# Patient Record
Sex: Female | Born: 1940 | Race: White | Hispanic: No | Marital: Single | Smoking: Never smoker
Health system: Southern US, Community
[De-identification: ages and names within clinical notes are randomized; demographics above are authoritative.]

## PROBLEM LIST (undated history)

## (undated) DIAGNOSIS — D649 Anemia, unspecified: Secondary | ICD-10-CM

## (undated) DIAGNOSIS — I10 Essential (primary) hypertension: Secondary | ICD-10-CM

## (undated) DIAGNOSIS — L409 Psoriasis, unspecified: Secondary | ICD-10-CM

## (undated) DIAGNOSIS — M419 Scoliosis, unspecified: Secondary | ICD-10-CM

## (undated) DIAGNOSIS — M1990 Unspecified osteoarthritis, unspecified site: Secondary | ICD-10-CM

## (undated) DIAGNOSIS — E7800 Pure hypercholesterolemia, unspecified: Secondary | ICD-10-CM

## (undated) DIAGNOSIS — M810 Age-related osteoporosis without current pathological fracture: Secondary | ICD-10-CM

## (undated) DIAGNOSIS — Z8489 Family history of other specified conditions: Secondary | ICD-10-CM

## (undated) HISTORY — PX: COLONOSCOPY: SHX174

## (undated) HISTORY — PX: CHOLECYSTECTOMY: SHX55

## (undated) HISTORY — PX: BACK SURGERY: SHX140

## (undated) HISTORY — PX: TONSILLECTOMY: SUR1361

## (undated) HISTORY — PX: BELPHAROPTOSIS REPAIR: SHX369

## (undated) HISTORY — PX: EYE SURGERY: SHX253

---

## 2004-09-19 ENCOUNTER — Ambulatory Visit: Payer: Self-pay | Admitting: Internal Medicine

## 2006-08-13 ENCOUNTER — Ambulatory Visit: Payer: Self-pay | Admitting: Gastroenterology

## 2007-08-06 ENCOUNTER — Ambulatory Visit: Payer: Self-pay | Admitting: Internal Medicine

## 2008-08-13 ENCOUNTER — Ambulatory Visit: Payer: Self-pay | Admitting: Internal Medicine

## 2009-05-24 ENCOUNTER — Ambulatory Visit: Payer: Self-pay | Admitting: Family Medicine

## 2009-10-25 ENCOUNTER — Ambulatory Visit: Payer: Self-pay | Admitting: Family Medicine

## 2010-11-21 ENCOUNTER — Ambulatory Visit: Payer: Self-pay | Admitting: Family Medicine

## 2011-08-02 ENCOUNTER — Ambulatory Visit: Payer: Self-pay | Admitting: Ophthalmology

## 2011-08-06 ENCOUNTER — Ambulatory Visit: Payer: Self-pay | Admitting: Ophthalmology

## 2011-09-11 ENCOUNTER — Ambulatory Visit: Payer: Self-pay | Admitting: Ophthalmology

## 2011-09-17 ENCOUNTER — Ambulatory Visit: Payer: Self-pay | Admitting: Ophthalmology

## 2012-01-21 ENCOUNTER — Ambulatory Visit: Payer: Self-pay | Admitting: Family Medicine

## 2013-02-24 ENCOUNTER — Ambulatory Visit: Payer: Self-pay | Admitting: Internal Medicine

## 2013-06-16 ENCOUNTER — Encounter: Payer: Self-pay | Admitting: Rheumatology

## 2014-03-08 ENCOUNTER — Ambulatory Visit: Payer: Self-pay | Admitting: Internal Medicine

## 2015-02-18 ENCOUNTER — Other Ambulatory Visit: Payer: Self-pay | Admitting: Internal Medicine

## 2015-02-18 DIAGNOSIS — Z1231 Encounter for screening mammogram for malignant neoplasm of breast: Secondary | ICD-10-CM

## 2015-03-14 ENCOUNTER — Other Ambulatory Visit: Payer: Self-pay | Admitting: Internal Medicine

## 2015-03-14 ENCOUNTER — Ambulatory Visit
Admission: RE | Admit: 2015-03-14 | Discharge: 2015-03-14 | Disposition: A | Discharge status: Home / Self Care | Payer: Medicare Other | Source: Ambulatory Visit | Attending: Internal Medicine | Admitting: Internal Medicine

## 2015-03-14 DIAGNOSIS — Z1231 Encounter for screening mammogram for malignant neoplasm of breast: Secondary | ICD-10-CM

## 2016-02-27 ENCOUNTER — Other Ambulatory Visit: Payer: Self-pay | Admitting: Internal Medicine

## 2016-02-27 DIAGNOSIS — Z1231 Encounter for screening mammogram for malignant neoplasm of breast: Secondary | ICD-10-CM

## 2016-03-14 ENCOUNTER — Ambulatory Visit
Admission: RE | Admit: 2016-03-14 | Discharge: 2016-03-14 | Disposition: A | Discharge status: Home / Self Care | Payer: Medicare Other | Source: Ambulatory Visit | Attending: Internal Medicine | Admitting: Internal Medicine

## 2016-03-14 DIAGNOSIS — Z1231 Encounter for screening mammogram for malignant neoplasm of breast: Secondary | ICD-10-CM | POA: Diagnosis present

## 2016-10-11 ENCOUNTER — Encounter: Payer: Self-pay | Admitting: *Deleted

## 2016-10-12 ENCOUNTER — Ambulatory Visit
Admission: RE | Admit: 2016-10-12 | Discharge: 2016-10-12 | Disposition: A | Discharge status: Home / Self Care | Payer: Medicare Other | Source: Ambulatory Visit | Attending: Unknown Physician Specialty | Admitting: Unknown Physician Specialty

## 2016-10-12 ENCOUNTER — Ambulatory Visit: Payer: Medicare Other | Admitting: Anesthesiology

## 2016-10-12 ENCOUNTER — Encounter: Payer: Self-pay | Admitting: Anesthesiology

## 2016-10-12 ENCOUNTER — Encounter
Admission: RE | Disposition: A | Discharge status: Home / Self Care | Payer: Self-pay | Source: Ambulatory Visit | Attending: Unknown Physician Specialty

## 2016-10-12 DIAGNOSIS — E7800 Pure hypercholesterolemia, unspecified: Secondary | ICD-10-CM | POA: Insufficient documentation

## 2016-10-12 DIAGNOSIS — L409 Psoriasis, unspecified: Secondary | ICD-10-CM | POA: Diagnosis not present

## 2016-10-12 DIAGNOSIS — K5730 Diverticulosis of large intestine without perforation or abscess without bleeding: Secondary | ICD-10-CM | POA: Diagnosis not present

## 2016-10-12 DIAGNOSIS — Z79899 Other long term (current) drug therapy: Secondary | ICD-10-CM | POA: Diagnosis not present

## 2016-10-12 DIAGNOSIS — Z7982 Long term (current) use of aspirin: Secondary | ICD-10-CM | POA: Insufficient documentation

## 2016-10-12 DIAGNOSIS — M810 Age-related osteoporosis without current pathological fracture: Secondary | ICD-10-CM | POA: Diagnosis not present

## 2016-10-12 DIAGNOSIS — Z1211 Encounter for screening for malignant neoplasm of colon: Secondary | ICD-10-CM | POA: Diagnosis present

## 2016-10-12 DIAGNOSIS — I10 Essential (primary) hypertension: Secondary | ICD-10-CM | POA: Insufficient documentation

## 2016-10-12 DIAGNOSIS — K640 First degree hemorrhoids: Secondary | ICD-10-CM | POA: Insufficient documentation

## 2016-10-12 DIAGNOSIS — D123 Benign neoplasm of transverse colon: Secondary | ICD-10-CM | POA: Diagnosis not present

## 2016-10-12 HISTORY — DX: Pure hypercholesterolemia, unspecified: E78.00

## 2016-10-12 HISTORY — DX: Psoriasis, unspecified: L40.9

## 2016-10-12 HISTORY — PX: COLONOSCOPY WITH PROPOFOL: SHX5780

## 2016-10-12 HISTORY — DX: Essential (primary) hypertension: I10

## 2016-10-12 HISTORY — DX: Age-related osteoporosis without current pathological fracture: M81.0

## 2016-10-12 SURGERY — COLONOSCOPY WITH PROPOFOL
Anesthesia: General

## 2016-10-12 MED ORDER — SODIUM CHLORIDE 0.9 % IV SOLN: INTRAVENOUS | Status: DC

## 2016-10-12 MED ORDER — FENTANYL CITRATE (PF) 100 MCG/2ML IJ SOLN
INTRAMUSCULAR | Status: DC | PRN
  Administered 2016-10-12: 50 ug via INTRAVENOUS

## 2016-10-12 MED ORDER — EPHEDRINE SULFATE 50 MG/ML IJ SOLN
INTRAMUSCULAR | Status: DC | PRN
  Administered 2016-10-12: 10 mg via INTRAVENOUS
  Administered 2016-10-12: 5 mg via INTRAVENOUS
  Administered 2016-10-12: 10 mg via INTRAVENOUS

## 2016-10-12 MED ORDER — SODIUM CHLORIDE 0.9 % IV SOLN
INTRAVENOUS | Status: DC
  Administered 2016-10-12: 10:00:00 via INTRAVENOUS

## 2016-10-12 MED ORDER — MIDAZOLAM HCL 2 MG/2ML IJ SOLN
INTRAMUSCULAR | Status: DC | PRN
  Administered 2016-10-12: 1 mg via INTRAVENOUS

## 2016-10-12 MED ORDER — PROPOFOL 500 MG/50ML IV EMUL
INTRAVENOUS | Status: DC | PRN
  Administered 2016-10-12: 100 ug/kg/min via INTRAVENOUS

## 2016-10-12 NOTE — Op Note (Signed)
[facility] Patient Name: Kristen Barrett Procedure Date: 10/12/2016 9:52 AM MRN: [id number] Account #: [id number] Date of Birth: 1941-03-29 Admit Type: Outpatient Age: 75 Room: [facility] ENDO ROOM 4 Gender: Female Note Status: Finalized Procedure:            Colonoscopy Indications:          Screening for colorectal malignant neoplasm Providers:            Manya Silvas, MD Referring MD:         Rusty Aus, MD (Referring MD) Medicines:            Propofol per Anesthesia Complications:        No immediate complications. Procedure:            Pre-Anesthesia Assessment:                       - After reviewing the risks and benefits, the patient                        was deemed in satisfactory condition to undergo the                        procedure.                       After obtaining informed consent, the colonoscope was                        passed under direct vision. Throughout the procedure,                        the patient's blood pressure, pulse, and oxygen                        saturations were monitored continuously. The                        Colonoscope was introduced through the anus and                        advanced to the the cecum, identified by appendiceal                        orifice and ileocecal valve. The colonoscopy was                        performed without difficulty. The patient tolerated the                        procedure well. The quality of the bowel preparation                        was good. Findings:      Two sessile polyps were found in the transverse colon. The polyps were       diminutive in size. These polyps were removed with a cold snare.       Resection and retrieval were complete.      Multiple small-medium-mouthed diverticula were found in the sigmoid       colon, descending colon, transverse colon and ascending colon.      Internal hemorrhoids were found  during endoscopy. The hemorrhoids  were       small and Grade I (internal hemorrhoids that do not prolapse).      The exam was otherwise without abnormality. Impression:           - Two diminutive polyps in the transverse colon,                        removed with a cold snare. Resected and retrieved.                       - Diverticulosis in the sigmoid colon, in the                        descending colon, in the transverse colon and in the                        ascending colon.                       - Internal hemorrhoids.                       - The examination was otherwise normal. Recommendation:       - Await pathology results. Manya Silvas, MD 10/12/2016 10:19:35 AM This report has been signed electronically. Number of Addenda: 0 Note Initiated On: 10/12/2016 9:52 AM Scope Withdrawal Time: 0 hours 13 minutes 35 seconds  Total Procedure Duration: 0 hours 18 minutes 52 seconds       [facility]

## 2016-10-12 NOTE — Anesthesia Preprocedure Evaluation (Signed)
Anesthesia Evaluation  Patient identified by MRN, date of birth, ID band Patient awake    Reviewed: Allergy & Precautions, NPO status , Patient's Chart, lab work & pertinent test results  History of Anesthesia Complications Negative for: history of anesthetic complications  Airway Mallampati: II  TM Distance: >3 FB Neck ROM: Full    Dental  (+) Upper Dentures, Lower Dentures   Pulmonary neg pulmonary ROS, neg sleep apnea, neg COPD,    breath sounds clear to auscultation- rhonchi (-) wheezing      Cardiovascular Exercise Tolerance: Good hypertension, Pt. on medications (-) CAD and (-) Past MI  Rhythm:Regular Rate:Normal - Systolic murmurs and - Diastolic murmurs    Neuro/Psych negative neurological ROS  negative psych ROS   GI/Hepatic negative GI ROS, Neg liver ROS,   Endo/Other  negative endocrine ROSneg diabetes  Renal/GU negative Renal ROS     Musculoskeletal negative musculoskeletal ROS (+)   Abdominal (+) - obese,   Peds  Hematology negative hematology ROS (+)   Anesthesia Other Findings Past Medical History: No date: High cholesterol No date: Hypertension No date: Osteoporosis No date: Psoriasis   Reproductive/Obstetrics                             Anesthesia Physical Anesthesia Plan  ASA: II  Anesthesia Plan: General   Post-op Pain Management:    Induction: Intravenous  Airway Management Planned: Natural Airway  Additional Equipment:   Intra-op Plan:   Post-operative Plan:   Informed Consent: I have reviewed the patients History and Physical, chart, labs and discussed the procedure including the risks, benefits and alternatives for the proposed anesthesia with the patient or authorized representative who has indicated his/her understanding and acceptance.   Dental advisory given  Plan Discussed with: CRNA and Anesthesiologist  Anesthesia Plan Comments:          Anesthesia Quick Evaluation

## 2016-10-12 NOTE — Anesthesia Procedure Notes (Signed)
Performed by: COOK-MARTIN, Ashwini Jago Pre-anesthesia Checklist: Patient identified, Emergency Drugs available, Suction available, Patient being monitored and Timeout performed Patient Re-evaluated:Patient Re-evaluated prior to inductionOxygen Delivery Method: Nasal cannula Preoxygenation: Pre-oxygenation with 100% oxygen Intubation Type: IV induction Placement Confirmation: positive ETCO2 and CO2 detector       

## 2016-10-12 NOTE — H&P (Signed)
   Primary Care Physician:  No primary care provider on file. Primary Gastroenterologist:  Dr. Vira Agar  Pre-Procedure History & Physical: HPI:  Kristen Barrett is a 75 y.o. female is here for an colonoscopy.   Past Medical History:  Diagnosis Date  . High cholesterol   . Hypertension   . Osteoporosis   . Psoriasis     Past Surgical History:  Procedure Laterality Date  . CHOLECYSTECTOMY    . COLONOSCOPY    . EYE SURGERY    . TONSILLECTOMY      Prior to Admission medications   Medication Sig Start Date End Date Taking? Authorizing Provider  alendronate (FOSAMAX) 70 MG tablet Take 70 mg by mouth once a week. Take with a full glass of water on an empty stomach.   Yes Historical Provider, MD  aspirin EC 81 MG tablet Take 81 mg by mouth daily.   Yes Historical Provider, MD  calcium carbonate (OSCAL) 1500 (600 Ca) MG TABS tablet Take 1,500 mg by mouth 2 (two) times daily with a meal.   Yes Historical Provider, MD  galantamine (RAZADYNE) 4 MG tablet Take 4 mg by mouth 2 (two) times daily with a meal.   Yes Historical Provider, MD  lisinopril-hydrochlorothiazide (PRINZIDE,ZESTORETIC) 20-25 MG tablet Take 1 tablet by mouth daily.   Yes Historical Provider, MD  Omega-3 Fatty Acids (FISH OIL) 1000 MG CAPS Take 1,200 mg by mouth.   Yes Historical Provider, MD    Allergies as of 09/11/2016  . (No Known Allergies)    Family History  Problem Relation Age of Onset  . Breast cancer Paternal Aunt 30    Social History   Social History  . Marital status: Single    Spouse name: N/A  . Number of children: N/A  . Years of education: N/A   Occupational History  . Not on file.   Social History Main Topics  . Smoking status: Never Smoker  . Smokeless tobacco: Never Used  . Alcohol use No  . Drug use: No  . Sexual activity: Not on file   Other Topics Concern  . Not on file   Social History Narrative  . No narrative on file    Review of Systems: See HPI, otherwise negative  ROS  Physical Exam: BP 128/72   Pulse 83   Temp (!) 96.8 F (36 C) (Tympanic)   Resp 16   Ht 5\' 3"  (1.6 m)   Wt 68 kg (150 lb)   SpO2 99%   BMI 26.57 kg/m  General:   Alert,  pleasant and cooperative in NAD Head:  Normocephalic and atraumatic. Neck:  Supple; no masses or thyromegaly. Lungs:  Clear throughout to auscultation.    Heart:  Regular rate and rhythm. Abdomen:  Soft, nontender and nondistended. Normal bowel sounds, without guarding, and without rebound.   Neurologic:  Alert and  oriented x4;  grossly normal neurologically.  Impression/Plan: Kristen Barrett is here for an colonoscopy to be performed for screening colonoscopy  Risks, benefits, limitations, and alternatives regarding  colonoscopy have been reviewed with the patient.  Questions have been answered.  All parties agreeable.   Gaylyn Cheers, MD  10/12/2016, 9:53 AM

## 2016-10-12 NOTE — Transfer of Care (Signed)
Immediate Anesthesia Transfer of Care Note  Patient: Kristen Barrett  Procedure(s) Performed: Procedure(s): COLONOSCOPY WITH PROPOFOL (N/A)  Patient Location: PACU  Anesthesia Type:General  Level of Consciousness: awake and sedated  Airway & Oxygen Therapy: Patient Spontanous Breathing and Patient connected to nasal cannula oxygen  Post-op Assessment: Report given to RN and Post -op Vital signs reviewed and stable  Post vital signs: Reviewed and stable  Last Vitals:  Vitals:   10/12/16 0927  BP: 128/72  Pulse: 83  Resp: 16  Temp: (!) 36 C    Last Pain:  Vitals:   10/12/16 0927  TempSrc: Tympanic         Complications: No apparent anesthesia complications

## 2016-10-12 NOTE — Anesthesia Postprocedure Evaluation (Signed)
Anesthesia Post Note  Patient: Kristen Barrett  Procedure(s) Performed: Procedure(s) (LRB): COLONOSCOPY WITH PROPOFOL (N/A)  Patient location during evaluation: Endoscopy Anesthesia Type: General Level of consciousness: awake and alert and oriented Pain management: pain level controlled Vital Signs Assessment: post-procedure vital signs reviewed and stable Respiratory status: spontaneous breathing, nonlabored ventilation and respiratory function stable Cardiovascular status: blood pressure returned to baseline and stable Postop Assessment: no signs of nausea or vomiting Anesthetic complications: no    Last Vitals:  Vitals:   10/12/16 1030 10/12/16 1040  BP: (!) 105/54 (!) 122/54  Pulse: 86 84  Resp: 16 15  Temp:      Last Pain:  Vitals:   10/12/16 0927  TempSrc: Tympanic                 Adriene Knipfer

## 2016-10-15 ENCOUNTER — Encounter: Payer: Self-pay | Admitting: Unknown Physician Specialty

## 2016-10-15 LAB — SURGICAL PATHOLOGY

## 2017-04-16 ENCOUNTER — Other Ambulatory Visit: Payer: Self-pay | Admitting: Internal Medicine

## 2017-04-16 DIAGNOSIS — Z1231 Encounter for screening mammogram for malignant neoplasm of breast: Secondary | ICD-10-CM

## 2017-05-30 ENCOUNTER — Ambulatory Visit
Admission: RE | Admit: 2017-05-30 | Discharge: 2017-05-30 | Disposition: A | Discharge status: Home / Self Care | Payer: Medicare Other | Source: Ambulatory Visit | Attending: Internal Medicine | Admitting: Internal Medicine

## 2017-05-30 DIAGNOSIS — Z1231 Encounter for screening mammogram for malignant neoplasm of breast: Secondary | ICD-10-CM | POA: Diagnosis not present

## 2018-06-16 ENCOUNTER — Other Ambulatory Visit: Payer: Self-pay | Admitting: Internal Medicine

## 2018-06-16 DIAGNOSIS — Z1231 Encounter for screening mammogram for malignant neoplasm of breast: Secondary | ICD-10-CM

## 2018-07-03 ENCOUNTER — Ambulatory Visit
Admission: RE | Admit: 2018-07-03 | Discharge: 2018-07-03 | Disposition: A | Discharge status: Home / Self Care | Payer: Medicare Other | Source: Ambulatory Visit | Attending: Internal Medicine | Admitting: Internal Medicine

## 2018-07-03 DIAGNOSIS — Z1231 Encounter for screening mammogram for malignant neoplasm of breast: Secondary | ICD-10-CM | POA: Insufficient documentation

## 2018-07-04 ENCOUNTER — Other Ambulatory Visit: Payer: Self-pay | Admitting: Internal Medicine

## 2018-07-04 DIAGNOSIS — M5441 Lumbago with sciatica, right side: Principal | ICD-10-CM

## 2018-07-04 DIAGNOSIS — M5442 Lumbago with sciatica, left side: Principal | ICD-10-CM

## 2018-07-04 DIAGNOSIS — G8929 Other chronic pain: Secondary | ICD-10-CM

## 2018-07-18 ENCOUNTER — Ambulatory Visit
Admission: RE | Admit: 2018-07-18 | Discharge: 2018-07-18 | Disposition: A | Discharge status: Home / Self Care | Payer: Medicare Other | Source: Ambulatory Visit | Attending: Internal Medicine | Admitting: Internal Medicine

## 2018-07-18 DIAGNOSIS — M48061 Spinal stenosis, lumbar region without neurogenic claudication: Secondary | ICD-10-CM | POA: Insufficient documentation

## 2018-07-18 DIAGNOSIS — G8929 Other chronic pain: Secondary | ICD-10-CM | POA: Diagnosis present

## 2018-07-18 DIAGNOSIS — M5441 Lumbago with sciatica, right side: Secondary | ICD-10-CM | POA: Insufficient documentation

## 2018-07-18 DIAGNOSIS — M5136 Other intervertebral disc degeneration, lumbar region: Secondary | ICD-10-CM | POA: Insufficient documentation

## 2018-07-18 DIAGNOSIS — M5442 Lumbago with sciatica, left side: Secondary | ICD-10-CM | POA: Insufficient documentation

## 2019-04-24 ENCOUNTER — Encounter
Admission: RE | Admit: 2019-04-24 | Discharge: 2019-04-24 | Disposition: A | Discharge status: Home / Self Care | Payer: Medicare Other | Source: Ambulatory Visit | Attending: Neurosurgery | Admitting: Neurosurgery

## 2019-04-24 ENCOUNTER — Other Ambulatory Visit: Payer: Self-pay

## 2019-04-24 DIAGNOSIS — I10 Essential (primary) hypertension: Secondary | ICD-10-CM | POA: Diagnosis not present

## 2019-04-24 DIAGNOSIS — Z1159 Encounter for screening for other viral diseases: Secondary | ICD-10-CM | POA: Diagnosis not present

## 2019-04-24 HISTORY — DX: Unspecified osteoarthritis, unspecified site: M19.90

## 2019-04-24 HISTORY — DX: Scoliosis, unspecified: M41.9

## 2019-04-24 HISTORY — DX: Family history of other specified conditions: Z84.89

## 2019-04-24 LAB — BASIC METABOLIC PANEL
Anion gap: 10 (ref 5–15)
BUN: 31 mg/dL — ABNORMAL HIGH (ref 8–23)
CO2: 22 mmol/L (ref 22–32)
Calcium: 8.9 mg/dL (ref 8.9–10.3)
Chloride: 103 mmol/L (ref 98–111)
Creatinine, Ser: 0.76 mg/dL (ref 0.44–1.00)
GFR calc Af Amer: >60 mL/min (ref >60)
GFR calc non Af Amer: >60 mL/min (ref >60)
Glucose, Bld: 107 mg/dL — ABNORMAL HIGH (ref 70–99)
Potassium: 4 mmol/L (ref 3.5–5.1)
Sodium: 135 mmol/L (ref 135–145)

## 2019-04-24 LAB — CBC
HCT: 41.2 % (ref 36.0–46.0)
Hemoglobin: 13.5 g/dL (ref 12.0–15.0)
MCH: 28.9 pg (ref 26.0–34.0)
MCHC: 32.8 g/dL (ref 30.0–36.0)
MCV: 88.2 fL (ref 80.0–100.0)
Platelets: 227 10*3/uL (ref 150–400)
RBC: 4.67 MIL/uL (ref 3.87–5.11)
RDW: 13.8 % (ref 11.5–15.5)
WBC: 8.4 10*3/uL (ref 4.0–10.5)
nRBC: 0 % (ref 0.0–0.2)

## 2019-04-24 LAB — APTT: aPTT: 31 seconds (ref 24–36)

## 2019-04-24 LAB — PROTIME-INR
INR: 1 (ref 0.8–1.2)
Prothrombin Time: 12.8 seconds (ref 11.4–15.2)

## 2019-04-24 NOTE — Patient Instructions (Signed)
Your procedure is scheduled on: 04-29-19 [facility] Report to Same Day Surgery 2nd floor medical mall [facility] Entrance-take elevator on left to 2nd floor.  Check in with surgery information desk.) To find out your arrival time please call [phone] between 1PM - 3PM on 04-28-19 TUESDAY  Remember: Instructions that are not followed completely may result in serious medical risk, up to and including death, or upon the discretion of your surgeon and anesthesiologist your surgery may need to be rescheduled.    _x___ 1. Do not eat food after midnight the night before your procedure. NO GUM OR CANDY AFTER MIDNIGHT. You may drink clear liquids up to 2 hours before you are scheduled to arrive at the hospital for your procedure.  Do not drink clear liquids within 2 hours of your scheduled arrival to the hospital.  Clear liquids include  --Water or Apple juice without pulp  --Clear carbohydrate beverage such as ClearFast or Gatorade  --Black Coffee or Clear Tea (No milk, no creamers, do not add anything to the coffee or Tea   ____Ensure clear carbohydrate drink on the way to the hospital for bariatric patients  ____Ensure clear carbohydrate drink 3 hours before surgery for Dr Dwyane Luo patients if physician instructed.    __x__ 2. No Alcohol for 24 hours before or after surgery.   __x__3. No Smoking or e-cigarettes for 24 prior to surgery.  Do not use any chewable tobacco products for at least 6 hour prior to surgery   ____  4. Bring all medications with you on the day of surgery if instructed.    __x__ 5. Notify your doctor if there is any change in your medical condition     (cold, fever, infections).    x___6. On the morning of surgery brush your teeth with toothpaste and water.  You may rinse your mouth with mouth wash if you wish.  Do not swallow any toothpaste or mouthwash.   Do not wear jewelry, make-up, hairpins, clips or nail polish.  Do not wear lotions, powders, or perfumes.  You may wear deodorant.  Do not shave 48 hours prior to surgery. Men may shave face and neck.  Do not bring valuables to the hospital.    [facility] is not responsible for any belongings or valuables.               Contacts, dentures or bridgework may not be worn into surgery.  Leave your suitcase in the car. After surgery it may be brought to your room.  For patients admitted to the hospital, discharge time is determined by your treatment team.  _  Patients discharged the day of surgery will not be allowed to drive home.  You will need someone to drive you home and stay with you the night of your procedure.    Please read over the following fact sheets that you were given:   [facility] Preparing for Surgery   _x___ TAKE THE FOLLOWING MEDICATION THE MORNING OF SURGERY WITH A SMALL SIP OF WATER. These include:  1. RAZADYNE (GALANTAMINE)  2.  3.  4.  5.  6.  ____Fleets enema or Magnesium Citrate as directed.   _x___ Use CHG Soap or sage wipes as directed on instruction sheet   ____ Use inhalers on the day of surgery and bring to hospital day of surgery  ____ Stop Metformin and Janumet 2 days prior to surgery.    ____ Take 1/2 of usual insulin dose the night before surgery  and none on the morning surgery.   _x___ Follow recommendations from Cardiologist, Pulmonologist or PCP regarding stopping Aspirin, Coumadin, Plavix ,Eliquis, Effient, or Pradaxa, and Pletal-STOP ASPIRIN NOW  X____Stop Anti-inflammatories such as Advil, Aleve, Ibuprofen, Motrin, Naproxen,MELOXICAM (MOBIC) Naprosyn, Goodies powders or aspirin products NOW-OK to take Tylenol   _x___ Stop supplements until after surgery-STOP [place]   ____ Bring C-Pap to the hospital.

## 2019-04-25 LAB — NOVEL CORONAVIRUS, NAA (HOSP ORDER, SEND-OUT TO REF LAB; TAT 18-24 HRS): SARS-CoV-2, NAA: NOT DETECTED

## 2019-04-27 ENCOUNTER — Other Ambulatory Visit: Payer: Self-pay

## 2019-04-27 ENCOUNTER — Encounter
Admission: RE | Admit: 2019-04-27 | Discharge: 2019-04-27 | Disposition: A | Discharge status: Home / Self Care | Payer: Medicare Other | Source: Ambulatory Visit | Attending: Neurosurgery | Admitting: Neurosurgery

## 2019-04-27 DIAGNOSIS — I10 Essential (primary) hypertension: Secondary | ICD-10-CM | POA: Diagnosis not present

## 2019-04-27 LAB — URINALYSIS, ROUTINE W REFLEX MICROSCOPIC
Bilirubin Urine: NEGATIVE
Glucose, UA: NEGATIVE mg/dL
Hgb urine dipstick: NEGATIVE
Ketones, ur: NEGATIVE mg/dL
Leukocytes,Ua: NEGATIVE
Nitrite: NEGATIVE
Protein, ur: NEGATIVE mg/dL
Specific Gravity, Urine: 1.016 (ref 1.005–1.030)
pH: 5 (ref 5.0–8.0)

## 2019-04-29 ENCOUNTER — Ambulatory Visit: Payer: Medicare Other | Admitting: Certified Registered Nurse Anesthetist

## 2019-04-29 ENCOUNTER — Other Ambulatory Visit: Payer: Self-pay

## 2019-04-29 ENCOUNTER — Encounter
Admission: RE | Disposition: A | Discharge status: Home / Self Care | Payer: Self-pay | Source: Home / Self Care | Attending: Neurosurgery

## 2019-04-29 ENCOUNTER — Ambulatory Visit
Admission: RE | Admit: 2019-04-29 | Discharge: 2019-04-29 | Disposition: A | Discharge status: Home / Self Care | Payer: Medicare Other | Attending: Neurosurgery | Admitting: Neurosurgery

## 2019-04-29 ENCOUNTER — Ambulatory Visit: Payer: Medicare Other

## 2019-04-29 ENCOUNTER — Encounter: Payer: Self-pay | Admitting: Certified Registered Nurse Anesthetist

## 2019-04-29 DIAGNOSIS — Z791 Long term (current) use of non-steroidal anti-inflammatories (NSAID): Secondary | ICD-10-CM | POA: Insufficient documentation

## 2019-04-29 DIAGNOSIS — M5116 Intervertebral disc disorders with radiculopathy, lumbar region: Secondary | ICD-10-CM | POA: Insufficient documentation

## 2019-04-29 DIAGNOSIS — Z79899 Other long term (current) drug therapy: Secondary | ICD-10-CM | POA: Insufficient documentation

## 2019-04-29 DIAGNOSIS — Z7982 Long term (current) use of aspirin: Secondary | ICD-10-CM | POA: Insufficient documentation

## 2019-04-29 DIAGNOSIS — I10 Essential (primary) hypertension: Secondary | ICD-10-CM | POA: Insufficient documentation

## 2019-04-29 DIAGNOSIS — Z419 Encounter for procedure for purposes other than remedying health state, unspecified: Secondary | ICD-10-CM

## 2019-04-29 HISTORY — PX: LUMBAR LAMINECTOMY/DECOMPRESSION MICRODISCECTOMY: SHX5026

## 2019-04-29 SURGERY — LUMBAR LAMINECTOMY/DECOMPRESSION MICRODISCECTOMY 1 LEVEL
Anesthesia: General | Laterality: Left

## 2019-04-29 MED ORDER — OXYCODONE HCL 5 MG PO TABS: 5.0000 mg | ORAL_TABLET | ORAL | 0 refills | Status: AC | PRN

## 2019-04-29 MED ORDER — LIDOCAINE HCL (PF) 2 % IJ SOLN
INTRAMUSCULAR | Status: AC
  Filled 2019-04-29: qty 10

## 2019-04-29 MED ORDER — KETAMINE HCL 50 MG/ML IJ SOLN
INTRAMUSCULAR | Status: DC | PRN
  Administered 2019-04-29: 50 mg via INTRAMUSCULAR

## 2019-04-29 MED ORDER — CEFAZOLIN SODIUM-DEXTROSE 2-4 GM/100ML-% IV SOLN
INTRAVENOUS | Status: AC
  Filled 2019-04-29: qty 100

## 2019-04-29 MED ORDER — SODIUM CHLORIDE 0.9 % IV SOLN
INTRAVENOUS | Status: DC | PRN
  Administered 2019-04-29: 40 ug/min via INTRAVENOUS

## 2019-04-29 MED ORDER — METHYLPREDNISOLONE ACETATE 40 MG/ML IJ SUSP
INTRAMUSCULAR | Status: DC | PRN
  Administered 2019-04-29: 40 mg via INTRA_ARTICULAR

## 2019-04-29 MED ORDER — BUPIVACAINE-EPINEPHRINE (PF) 0.5% -1:200000 IJ SOLN
INTRAMUSCULAR | Status: DC | PRN
  Administered 2019-04-29: 6 mL

## 2019-04-29 MED ORDER — TIZANIDINE HCL 2 MG PO TABS: 2.0000 mg | ORAL_TABLET | Freq: Four times a day (QID) | ORAL | 0 refills | Status: AC | PRN

## 2019-04-29 MED ORDER — PROPOFOL 10 MG/ML IV BOLUS
INTRAVENOUS | Status: DC | PRN
  Administered 2019-04-29: 100 mg via INTRAVENOUS

## 2019-04-29 MED ORDER — SODIUM CHLORIDE 0.9 % IR SOLN
Status: DC | PRN
  Administered 2019-04-29: 1000 mL

## 2019-04-29 MED ORDER — FENTANYL CITRATE (PF) 100 MCG/2ML IJ SOLN
INTRAMUSCULAR | Status: DC | PRN
  Administered 2019-04-29 (×4): 50 ug via INTRAVENOUS

## 2019-04-29 MED ORDER — ONDANSETRON HCL 4 MG/2ML IJ SOLN
INTRAMUSCULAR | Status: AC
  Filled 2019-04-29: qty 2

## 2019-04-29 MED ORDER — ONDANSETRON HCL 4 MG/2ML IJ SOLN
INTRAMUSCULAR | Status: DC | PRN
  Administered 2019-04-29: 4 mg via INTRAVENOUS

## 2019-04-29 MED ORDER — SODIUM CHLORIDE 0.9 % IV SOLN
INTRAVENOUS | Status: DC | PRN
  Administered 2019-04-29: 50 mL

## 2019-04-29 MED ORDER — FAMOTIDINE 20 MG PO TABS
20.0000 mg | ORAL_TABLET | Freq: Once | ORAL | Status: AC
  Administered 2019-04-29: 20 mg via ORAL

## 2019-04-29 MED ORDER — PROPOFOL 10 MG/ML IV BOLUS
INTRAVENOUS | Status: AC
  Filled 2019-04-29: qty 20

## 2019-04-29 MED ORDER — FENTANYL CITRATE (PF) 250 MCG/5ML IJ SOLN
INTRAMUSCULAR | Status: AC
  Filled 2019-04-29: qty 5

## 2019-04-29 MED ORDER — HYDROMORPHONE HCL 1 MG/ML IJ SOLN: 0.2500 mg | INTRAMUSCULAR | Status: DC | PRN

## 2019-04-29 MED ORDER — THROMBIN 5000 UNITS EX SOLR
CUTANEOUS | Status: DC | PRN
  Administered 2019-04-29: 5000 [IU] via TOPICAL

## 2019-04-29 MED ORDER — DEXAMETHASONE SODIUM PHOSPHATE 10 MG/ML IJ SOLN
INTRAMUSCULAR | Status: AC
  Filled 2019-04-29: qty 1

## 2019-04-29 MED ORDER — ROCURONIUM BROMIDE 100 MG/10ML IV SOLN
INTRAVENOUS | Status: DC | PRN
  Administered 2019-04-29: 5 mg via INTRAVENOUS

## 2019-04-29 MED ORDER — FAMOTIDINE 20 MG PO TABS
ORAL_TABLET | ORAL | Status: AC
  Administered 2019-04-29: 20 mg via ORAL
  Filled 2019-04-29: qty 1

## 2019-04-29 MED ORDER — ACETAMINOPHEN 10 MG/ML IV SOLN
INTRAVENOUS | Status: AC
  Filled 2019-04-29: qty 100

## 2019-04-29 MED ORDER — PHENYLEPHRINE HCL (PRESSORS) 10 MG/ML IV SOLN
INTRAVENOUS | Status: DC | PRN
  Administered 2019-04-29: 150 ug via INTRAVENOUS
  Administered 2019-04-29 (×4): 100 ug via INTRAVENOUS
  Administered 2019-04-29: 200 ug via INTRAVENOUS

## 2019-04-29 MED ORDER — ROCURONIUM BROMIDE 50 MG/5ML IV SOLN
INTRAVENOUS | Status: AC
  Filled 2019-04-29: qty 1

## 2019-04-29 MED ORDER — SUCCINYLCHOLINE CHLORIDE 20 MG/ML IJ SOLN
INTRAMUSCULAR | Status: DC | PRN
  Administered 2019-04-29: 70 mg via INTRAVENOUS

## 2019-04-29 MED ORDER — LIDOCAINE HCL (CARDIAC) PF 100 MG/5ML IV SOSY
PREFILLED_SYRINGE | INTRAVENOUS | Status: DC | PRN
  Administered 2019-04-29: 60 mg via INTRAVENOUS

## 2019-04-29 MED ORDER — DEXAMETHASONE SODIUM PHOSPHATE 10 MG/ML IJ SOLN
INTRAMUSCULAR | Status: DC | PRN
  Administered 2019-04-29: 10 mg via INTRAVENOUS

## 2019-04-29 MED ORDER — CEFAZOLIN SODIUM-DEXTROSE 2-4 GM/100ML-% IV SOLN
2.0000 g | Freq: Once | INTRAVENOUS | Status: AC
  Administered 2019-04-29: 10:00:00 2 g via INTRAVENOUS

## 2019-04-29 MED ORDER — ACETAMINOPHEN 10 MG/ML IV SOLN
INTRAVENOUS | Status: DC | PRN
  Administered 2019-04-29: 1000 mg via INTRAVENOUS

## 2019-04-29 MED ORDER — LACTATED RINGERS IV SOLN
INTRAVENOUS | Status: DC
  Administered 2019-04-29: 09:00:00 via INTRAVENOUS

## 2019-04-29 MED ORDER — SUCCINYLCHOLINE CHLORIDE 20 MG/ML IJ SOLN
INTRAMUSCULAR | Status: AC
  Filled 2019-04-29: qty 1

## 2019-04-29 MED ORDER — LIDOCAINE HCL 4 % MT SOLN
OROMUCOSAL | Status: DC | PRN
  Administered 2019-04-29: 4 mL via TOPICAL

## 2019-04-29 SURGICAL SUPPLY — 58 items
BUR NEURO DRILL SOFT 3.0X3.8M (BURR) ×3 IMPLANT
CANISTER SUCT 1200ML W/VALVE (MISCELLANEOUS) ×6 IMPLANT
CHLORAPREP W/TINT 26 (MISCELLANEOUS) ×6 IMPLANT
CNTNR SPEC 2.5X3XGRAD LEK (MISCELLANEOUS) ×1
CONT SPEC 4OZ STER OR WHT (MISCELLANEOUS) ×2
CONTAINER SPEC 2.5X3XGRAD LEK (MISCELLANEOUS) ×1 IMPLANT
COUNTER NEEDLE 20/40 LG (NEEDLE) ×3 IMPLANT
COVER LIGHT HANDLE STERIS (MISCELLANEOUS) ×6 IMPLANT
COVER WAND RF STERILE (DRAPES) ×3 IMPLANT
CUP MEDICINE 2OZ PLAST GRAD ST (MISCELLANEOUS) ×6 IMPLANT
DERMABOND ADVANCED (GAUZE/BANDAGES/DRESSINGS) ×2
DERMABOND ADVANCED .7 DNX12 (GAUZE/BANDAGES/DRESSINGS) ×1 IMPLANT
DRAPE C-ARM 42X72 X-RAY (DRAPES) ×6 IMPLANT
DRAPE LAPAROTOMY 100X77 ABD (DRAPES) ×3 IMPLANT
DRAPE MICROSCOPE SPINE 48X150 (DRAPES) ×3 IMPLANT
DRAPE POUCH INSTRU U-SHP 10X18 (DRAPES) ×3 IMPLANT
DRAPE SURG 17X11 SM STRL (DRAPES) ×12 IMPLANT
ELECT CAUTERY BLADE TIP 2.5 (TIP) ×3
ELECT EZSTD 165MM 6.5IN (MISCELLANEOUS)
ELECT REM PT RETURN 9FT ADLT (ELECTROSURGICAL) ×3
ELECTRODE CAUTERY BLDE TIP 2.5 (TIP) ×1 IMPLANT
ELECTRODE EZSTD 165MM 6.5IN (MISCELLANEOUS) IMPLANT
ELECTRODE REM PT RTRN 9FT ADLT (ELECTROSURGICAL) ×1 IMPLANT
FRAME EYE SHIELD (PROTECTIVE WEAR) ×6 IMPLANT
GLOVE BIOGEL PI IND STRL 7.0 (GLOVE) ×1 IMPLANT
GLOVE BIOGEL PI INDICATOR 7.0 (GLOVE) ×2
GLOVE SURG SYN 7.0 (GLOVE) ×6 IMPLANT
GLOVE SURG SYN 7.0 PF PI (GLOVE) ×2 IMPLANT
GLOVE SURG SYN 8.5  E (GLOVE) ×6
GLOVE SURG SYN 8.5 E (GLOVE) ×3 IMPLANT
GLOVE SURG SYN 8.5 PF PI (GLOVE) ×3 IMPLANT
GOWN SRG XL LVL 3 NONREINFORCE (GOWNS) ×1 IMPLANT
GOWN STRL NON-REIN TWL XL LVL3 (GOWNS) ×2
GOWN STRL REUS W/TWL MED LVL3 (GOWN DISPOSABLE) ×3 IMPLANT
GRADUATE 1200CC STRL 31836 (MISCELLANEOUS) ×3 IMPLANT
KIT SPINAL PRONEVIEW (KITS) ×3 IMPLANT
KNIFE BAYONET SHORT DISCETOMY (MISCELLANEOUS) ×2 IMPLANT
MARKER SKIN DUAL TIP RULER LAB (MISCELLANEOUS) ×3 IMPLANT
NDL SAFETY ECLIPSE 18X1.5 (NEEDLE) ×1 IMPLANT
NEEDLE HYPO 18GX1.5 SHARP (NEEDLE) ×2
NEEDLE HYPO 22GX1.5 SAFETY (NEEDLE) ×3 IMPLANT
NS IRRIG 1000ML POUR BTL (IV SOLUTION) ×3 IMPLANT
PACK LAMINECTOMY NEURO (CUSTOM PROCEDURE TRAY) ×3 IMPLANT
PAD ARMBOARD 7.5X6 YLW CONV (MISCELLANEOUS) ×3 IMPLANT
SPOGE SURGIFLO 8M (HEMOSTASIS) ×2
SPONGE SURGIFLO 8M (HEMOSTASIS) ×1 IMPLANT
SUT DVC VLOC 3-0 CL 6 P-12 (SUTURE) ×3 IMPLANT
SUT VIC AB 0 CT1 27 (SUTURE) ×2
SUT VIC AB 0 CT1 27XCR 8 STRN (SUTURE) ×1 IMPLANT
SUT VIC AB 2-0 CT1 18 (SUTURE) ×3 IMPLANT
SYR 10ML LL (SYRINGE) ×3 IMPLANT
SYR 20CC LL (SYRINGE) ×3 IMPLANT
SYR 30ML LL (SYRINGE) ×6 IMPLANT
SYR 3ML LL SCALE MARK (SYRINGE) ×3 IMPLANT
TOWEL OR 17X26 4PK STRL BLUE (TOWEL DISPOSABLE) ×9 IMPLANT
TUBE METRX 18MMX5CM (INSTRUMENTS) ×2 IMPLANT
TUBING CONNECTING 10 (TUBING) ×2 IMPLANT
TUBING CONNECTING 10' (TUBING) ×1

## 2019-04-29 NOTE — Anesthesia Preprocedure Evaluation (Addendum)
Anesthesia Evaluation  Patient identified by MRN, date of birth, ID band Patient awake    Reviewed: Allergy & Precautions, H&P , NPO status , Patient's Chart, lab work & pertinent test results  Airway Mallampati: II  TM Distance: >3 FB Neck ROM: full    Dental  (+) Upper Dentures, Lower Dentures   Pulmonary neg pulmonary ROS, neg shortness of breath, neg sleep apnea, neg COPD, neg recent URI,           Cardiovascular hypertension, (-) angina(-) Past MI, (-) Cardiac Stents and (-) CABG (-) dysrhythmias      Neuro/Psych negative neurological ROS  negative psych ROS   GI/Hepatic negative GI ROS, Neg liver ROS,   Endo/Other  negative endocrine ROS  Renal/GU      Musculoskeletal   Abdominal   Peds  Hematology negative hematology ROS (+)   Anesthesia Other Findings Past Medical History: No date: Arthritis No date: Family history of adverse reaction to anesthesia     Comment:  MOM-N/V No date: High cholesterol No date: Hypertension No date: Osteoporosis No date: Psoriasis No date: Scoliosis  Past Surgical History: No date: CHOLECYSTECTOMY No date: COLONOSCOPY 10/12/2016: COLONOSCOPY WITH PROPOFOL; N/A     Comment:  Procedure: COLONOSCOPY WITH PROPOFOL;  Surgeon: Manya Silvas, MD;  Location: [facility] ENDOSCOPY;  Service:               Endoscopy;  Laterality: N/A; No date: EYE SURGERY     Comment:  CATARACTS BIL No date: TONSILLECTOMY  BMI    Body Mass Index: 24.60 kg/m      Reproductive/Obstetrics negative OB ROS                            Anesthesia Physical Anesthesia Plan  ASA: II  Anesthesia Plan: General ETT   Post-op Pain Management:    Induction:   PONV Risk Score and Plan: Ondansetron, Dexamethasone, Midazolam and Treatment may vary due to age or medical condition  Airway Management Planned:   Additional Equipment:   Intra-op Plan:   Post-operative  Plan:   Informed Consent: I have reviewed the patients History and Physical, chart, labs and discussed the procedure including the risks, benefits and alternatives for the proposed anesthesia with the patient or authorized representative who has indicated his/her understanding and acceptance.     Dental Advisory Given  Plan Discussed with: Anesthesiologist and CRNA  Anesthesia Plan Comments:         Anesthesia Quick Evaluation

## 2019-04-29 NOTE — Transfer of Care (Signed)
Immediate Anesthesia Transfer of Care Note  Patient: Kristen Barrett  Procedure(s) Performed: LUMBAR LAMINECTOMY/DECOMPRESSION MICRODISCECTOMY L4-5 LEFT (Left )  Patient Location: PACU  Anesthesia Type:General  Level of Consciousness: drowsy  Airway & Oxygen Therapy: Patient Spontanous Breathing and Patient connected to face mask oxygen  Post-op Assessment: Report given to RN and Post -op Vital signs reviewed and stable  Post vital signs: Reviewed and stable  Last Vitals:  Vitals Value Taken Time  BP 127/69 04/29/19 1126  Temp    Pulse 75 04/29/19 1128  Resp 14 04/29/19 1128  SpO2 98 % 04/29/19 1128  Vitals shown include unvalidated device data.  Last Pain:  Vitals:   04/29/19 0917  TempSrc: Tympanic  PainSc: 0-No pain      Patients Stated Pain Goal: 0 (41/96/22 [id number])  Complications: No apparent anesthesia complications

## 2019-04-29 NOTE — H&P (Signed)
  I have reviewed and confirmed my history and physical from 04/23/2019 with no additions or changes. Plan for left L4-5 microdiscectomy and decompression.  Risks and benefits reviewed.  Heart sounds normal no MRG. Chest Clear to Auscultation Bilaterally.

## 2019-04-29 NOTE — Discharge Summary (Signed)
Procedure: L4-5 microdiscectomy and decompression Procedure date: 04/29/2019 Diagnosis: Lumbar radiculopathy  History: Kristen Barrett is s/p L4-5 microdiscectomy and decompression for lumbar radiculopathy  POD0: Tolerated procedure well without complication.  Valuated in postop recovery still disoriented from anesthesia but able to answer questions and obey commands.  Denies any lower extremity pain at this time but states that she normally would not be experiencing pain when laying down.  Denies any new numbness or tingling.  Denies back pain.  Physical Exam: Vitals:   04/29/19 0917  BP: (!) 141/77  Pulse: 82  Resp: 16  Temp: (!) 97.2 F (36.2 C)  SpO2: 100%   Strength:5/5 throughout lower extremities Sensation: Intact and symmetric throughout lower extremities Skin: Glue intact at incision site  Data:  Recent Labs  Lab 04/24/19 1103  NA 135  K 4.0  CL 103  CO2 22  BUN 31*  CREATININE 0.76  GLUCOSE 107*  CALCIUM 8.9   No results for input(s): AST, ALT, ALKPHOS in the last 168 hours.  Invalid input(s): TBILI   Recent Labs  Lab 04/24/19 1103  WBC 8.4  HGB 13.5  HCT 41.2  PLT 227   Recent Labs  Lab 04/24/19 1103  APTT 31  INR 1.0         Other tests/results: No imaging reviewed  Assessment/Plan:  Kristen Barrett is POD 0 status post L4-5 lumbar decompression for lumbar radiculopathy.  She is recovering well.  We will continue postop pain management with Tylenol, Robaxin, oxycodone as needed.  She is scheduled to follow-up in clinic in approximately 2 weeks to monitor progress.  Marin Olp PA-C Department of Neurosurgery

## 2019-04-29 NOTE — Anesthesia Procedure Notes (Signed)
Procedure Name: Intubation Date/Time: 04/29/2019 9:49 AM Performed by: Eben Burow, CRNA Pre-anesthesia Checklist: Patient identified, Emergency Drugs available, Suction available and Patient being monitored Patient Re-evaluated:Patient Re-evaluated prior to induction Oxygen Delivery Method: Circle system utilized Preoxygenation: Pre-oxygenation with 100% oxygen Induction Type: IV induction Ventilation: Mask ventilation without difficulty Laryngoscope Size: Mac and 3 Grade View: Grade I Tube type: Oral Tube size: 7.0 mm Number of attempts: 1 Airway Equipment and Method: Stylet and LTA kit utilized Placement Confirmation: ETT inserted through vocal cords under direct vision,  positive ETCO2 and breath sounds checked- equal and bilateral Secured at: 20 cm Tube secured with: Tape Dental Injury: Teeth and Oropharynx as per pre-operative assessment

## 2019-04-29 NOTE — Anesthesia Post-op Follow-up Note (Signed)
Anesthesia QCDR form completed.        

## 2019-04-29 NOTE — Op Note (Signed)
Indications: Kristen Barrett is a 78 yo female who presented with lumbar radiculopathy.  She failed conservative management and elected for surgical intervention.  Findings: extensive ligamentum flavum overgrowth, disc herniation at L4-5  Preoperative Diagnosis: Lumbar radiculopathy Postoperative Diagnosis: same   EBL: 25 ml IVF: 650 ml Drains: none Disposition: Extubated and Stable to PACU Complications: none  No foley catheter was placed.   Preoperative Note:   Risks of surgery discussed include: infection, bleeding, stroke, coma, death, paralysis, CSF leak, nerve/spinal cord injury, numbness, tingling, weakness, complex regional pain syndrome, recurrent stenosis and/or disc herniation, vascular injury, development of instability, neck/back pain, need for further surgery, persistent symptoms, development of deformity, and the risks of anesthesia. The patient understood these risks and agreed to proceed.  Operative Note:   1) left L4/5 microdiscectomy  The patient was then brought from the preoperative center with intravenous access established.  The patient underwent general anesthesia and endotracheal tube intubation, and was then rotated on the [place] rail top where all pressure points were appropriately padded.  The skin was then thoroughly cleansed.  Perioperative antibiotic prophylaxis was administered.  Sterile prep and drapes were then applied and a timeout was then observed.  C-arm was brought into the field under sterile conditions, and the L4-5 disc space identified and marked with an incision on the left 1cm lateral to midline.  Once this was complete a 2 cm incision was opened with the use of a #10 blade knife.  The Metrx tubes were sequentially advanced under lateral fluoroscopy until a 18 x 50 mm Metrx tube was placed over the facet and lamina and secured to the bed.    The microscope was then sterilely brought into the field and muscle creep was hemostased with a bipolar  and resected with a pituitary rongeur.  A Bovie extender was then used to expose the spinous process and lamina.  Careful attention was placed to not violate the facet capsule. A 3 mm matchstick drill bit was then used to make a hemi-laminotomy trough until the ligamentum flavum was exposed.  This was extended to the base of the spinous process.  Once this was complete and the underlying ligamentum flavum was visualized this was dissected with an up angle curette and resected with a #2 and #3 mm biting Kerrison.  The laminotomy opening was also expanded in similar fashion and hemostasis was obtained with Surgifoam and a patty as well as bone wax.  The rostral aspect of the caudal level of the lamina was also resected with a #2 biting Kerrison effort to further enhance exposure.  There was extensive left-sided ligamentum overgrowth.  This was carefully removed until the dorsolateral dura was completely decompressed.  We then moved to the discectomy.   Once the underlying dura was visualized a Penfield 4 was then used to dissect and expose the traversing nerve root.  Once this was identified a nerve root retractor suction was used to mobilize this medially.  The venous plexus was hemostased with Surgifoam and light bipolar use.  A small annulotomy knife was then used to make a small annulotomy within the disc space and disc space contents were noted to come through the annulus.    The disc herniation was identified and dissected free using a balltip probe. The pituitary rongeur was used to remove the extruded disc fragments. Once the thecal sac and nerve root were noted to be relaxed and under less tension the ball-tipped feeler was passed along the foramen distally to to  ensure no residual compression was noted.    A Depo-Medrol soaked Gelfoam pledget was placed along the nerve root for 2 minutes and removed.  The area was irrigated. The tube system was then removed under microscopic visualization and  hemostasis was obtained with a bipolar.    The fascial layer was reapproximated with the use of a 0- Vicryl suture.  Subcutaneous tissue layer was reapproximated using 2-0 Vicryl suture.  3-0 monocryl was used on the skin. The skin was then cleansed and Dermabond was used to close the skin opening.  Patient was then rotated back to the preoperative bed awakened from anesthesia and taken to recovery all counts are correct in this case.   I performed the entire procedure with the assistance of Marin Olp PA as an Pensions consultant.  Meade Maw MD

## 2019-04-29 NOTE — Discharge Instructions (Addendum)
°Your surgeon has performed an operation on your lumbar spine (low back) to relieve pressure on one or more nerves. Many times, patients feel better immediately after surgery and can “overdo it.” Even if you feel well, it is important that you follow these activity guidelines. If you do not let your back heal properly from the surgery, you can increase the chance of a disc herniation and/or return of your symptoms. The following are instructions to help in your recovery once you have been discharged from the hospital. ° °* Do not take anti-inflammatory medications for 3 days after surgery (naproxen [Aleve], ibuprofen [Advil, Motrin], celecoxib [Celebrex], etc.) ° °Activity  °  °No bending, lifting, or twisting (“BLT”). Avoid lifting objects heavier than 10 pounds (gallon milk jug).  Where possible, avoid household activities that involve lifting, bending, pushing, or pulling such as laundry, vacuuming, grocery shopping, and childcare. Try to arrange for help from friends and family for these activities while your back heals. ° °Increase physical activity slowly as tolerated.  Taking short walks is encouraged, but avoid strenuous exercise. Do not jog, run, bicycle, lift weights, or participate in any other exercises unless specifically allowed by your doctor. Avoid prolonged sitting, including car rides. ° °Talk to your doctor before resuming sexual activity. ° °You should not drive until cleared by your doctor. ° °Until released by your doctor, you should not return to work or school.  You should rest at home and let your body heal.  ° °You may shower two days after your surgery.  After showering, lightly dab your incision dry. Do not take a tub bath or go swimming for 3 weeks, or until approved by your doctor at your follow-up appointment. ° °If you smoke, we strongly recommend that you quit.  Smoking has been proven to interfere with normal healing in your back and will dramatically reduce the success rate of  your surgery. Please contact [facility] (800-QUIT-NOW) and use the resources at www.[facility].com for assistance in stopping smoking. ° °Surgical Incision °  °If you have a dressing on your incision, you may remove it three days after your surgery. Keep your incision area clean and dry. ° °If you have staples or stitches on your incision, you should have a follow up scheduled for removal. If you do not have staples or stitches, you will have steri-strips (small pieces of surgical tape) or Dermabond glue. The steri-strips/glue should begin to peel away within about a week (it is fine if the steri-strips fall off before then). If the strips are still in place one week after your surgery, you may gently remove them. ° °Diet          ° ° You may return to your usual diet. Be sure to stay hydrated. ° °When to Contact Us ° °Although your surgery and recovery will likely be uneventful, you may have some residual numbness, aches, and pains in your back and/or legs. This is normal and should improve in the next few weeks. ° °However, should you experience any of the following, contact us immediately: °• New numbness or weakness °• Pain that is progressively getting worse, and is not relieved by your pain medications or rest °• Bleeding, redness, swelling, pain, or drainage from surgical incision °• Chills or flu-like symptoms °• Fever greater than 101.0 F (38.3 C) °• Problems with bowel or bladder functions °• Difficulty breathing or shortness of breath °• Warmth, tenderness, or swelling in your calf ° °Contact Information °• During office hours (Monday-Friday   9 am to 5 pm), please call your physician at [phone] °• After hours and weekends, please call the [facility] at [phone] and ask for the Neurosurgery Resident On Call  °• For a life-threatening emergency, call 911 ° °AMBULATORY SURGERY  °DISCHARGE INSTRUCTIONS ° ° °1) The drugs that you were given will stay in your system until tomorrow so for the next 24  hours you should not: ° °A) Drive an automobile °B) Make any legal decisions °C) Drink any alcoholic beverage ° ° °2) You may resume regular meals tomorrow.  Today it is better to start with liquids and gradually work up to solid foods. ° °You may eat anything you prefer, but it is better to start with liquids, then soup and crackers, and gradually work up to solid foods. ° ° °3) Please notify your doctor immediately if you have any unusual bleeding, trouble breathing, redness and pain at the surgery site, drainage, fever, or pain not relieved by medication. ° ° ° °4) Additional Instructions: ° °Please contact your physician with any problems or Same Day Surgery at [phone], Monday through Friday 6 am to 4 pm, or [facility] number at [phone]. ° °

## 2019-04-30 NOTE — Anesthesia Postprocedure Evaluation (Signed)
Anesthesia Post Note  Patient: Kristen Barrett  Procedure(s) Performed: LUMBAR LAMINECTOMY/DECOMPRESSION MICRODISCECTOMY L4-5 LEFT (Left )  Patient location during evaluation: PACU Anesthesia Type: General Level of consciousness: awake and alert Pain management: pain level controlled Vital Signs Assessment: post-procedure vital signs reviewed and stable Respiratory status: spontaneous breathing, nonlabored ventilation and respiratory function stable Cardiovascular status: blood pressure returned to baseline and stable Postop Assessment: no apparent nausea or vomiting Anesthetic complications: no     Last Vitals:  Vitals:   04/29/19 1257 04/29/19 1317  BP: 133/68 (P) 126/65  Pulse: 76 (P) 73  Resp: 18 (P) 18  Temp: 36.6 C   SpO2: 99% (P) 100%    Last Pain:  Vitals:   04/30/19 0905  TempSrc:   PainSc: [place]

## 2019-06-05 ENCOUNTER — Other Ambulatory Visit: Payer: Self-pay | Admitting: Internal Medicine

## 2019-06-05 DIAGNOSIS — Z1231 Encounter for screening mammogram for malignant neoplasm of breast: Secondary | ICD-10-CM

## 2019-07-08 ENCOUNTER — Ambulatory Visit
Admission: RE | Admit: 2019-07-08 | Discharge: 2019-07-08 | Disposition: A | Discharge status: Home / Self Care | Payer: Medicare Other | Source: Ambulatory Visit | Attending: Internal Medicine | Admitting: Internal Medicine

## 2019-07-08 DIAGNOSIS — Z1231 Encounter for screening mammogram for malignant neoplasm of breast: Secondary | ICD-10-CM | POA: Insufficient documentation

## 2020-06-22 ENCOUNTER — Other Ambulatory Visit: Payer: Self-pay | Admitting: Internal Medicine

## 2020-06-22 DIAGNOSIS — Z1231 Encounter for screening mammogram for malignant neoplasm of breast: Secondary | ICD-10-CM

## 2020-07-08 ENCOUNTER — Other Ambulatory Visit: Payer: Self-pay

## 2020-07-08 ENCOUNTER — Ambulatory Visit
Admission: RE | Admit: 2020-07-08 | Discharge: 2020-07-08 | Disposition: A | Discharge status: Home / Self Care | Payer: Medicare Other | Source: Ambulatory Visit | Attending: Internal Medicine | Admitting: Internal Medicine

## 2020-07-08 DIAGNOSIS — Z1231 Encounter for screening mammogram for malignant neoplasm of breast: Secondary | ICD-10-CM | POA: Insufficient documentation

## 2020-12-28 ENCOUNTER — Other Ambulatory Visit
Admission: RE | Admit: 2020-12-28 | Discharge: 2020-12-28 | Disposition: A | Discharge status: Home / Self Care | Payer: Medicare Other | Source: Ambulatory Visit | Attending: Gastroenterology | Admitting: Gastroenterology

## 2020-12-28 ENCOUNTER — Other Ambulatory Visit: Payer: Self-pay

## 2020-12-28 DIAGNOSIS — Z20822 Contact with and (suspected) exposure to covid-19: Secondary | ICD-10-CM | POA: Insufficient documentation

## 2020-12-28 DIAGNOSIS — Z01812 Encounter for preprocedural laboratory examination: Secondary | ICD-10-CM | POA: Diagnosis present

## 2020-12-28 LAB — SARS CORONAVIRUS 2 (TAT 6-24 HRS): SARS Coronavirus 2: NEGATIVE

## 2020-12-29 ENCOUNTER — Encounter: Payer: Self-pay | Admitting: *Deleted

## 2020-12-30 ENCOUNTER — Ambulatory Visit: Payer: Medicare Other | Admitting: Certified Registered Nurse Anesthetist

## 2020-12-30 ENCOUNTER — Encounter: Payer: Self-pay | Admitting: *Deleted

## 2020-12-30 ENCOUNTER — Ambulatory Visit
Admission: RE | Admit: 2020-12-30 | Discharge: 2020-12-30 | Disposition: A | Discharge status: Home / Self Care | Payer: Medicare Other | Attending: Gastroenterology | Admitting: Gastroenterology

## 2020-12-30 ENCOUNTER — Encounter
Admission: RE | Disposition: A | Discharge status: Home / Self Care | Payer: Self-pay | Source: Home / Self Care | Attending: Gastroenterology

## 2020-12-30 DIAGNOSIS — K2940 Chronic atrophic gastritis without bleeding: Secondary | ICD-10-CM | POA: Diagnosis not present

## 2020-12-30 DIAGNOSIS — I10 Essential (primary) hypertension: Secondary | ICD-10-CM | POA: Insufficient documentation

## 2020-12-30 DIAGNOSIS — K5711 Diverticulosis of small intestine without perforation or abscess with bleeding: Secondary | ICD-10-CM | POA: Diagnosis not present

## 2020-12-30 DIAGNOSIS — E7800 Pure hypercholesterolemia, unspecified: Secondary | ICD-10-CM | POA: Insufficient documentation

## 2020-12-30 DIAGNOSIS — K449 Diaphragmatic hernia without obstruction or gangrene: Secondary | ICD-10-CM | POA: Insufficient documentation

## 2020-12-30 DIAGNOSIS — K3189 Other diseases of stomach and duodenum: Secondary | ICD-10-CM | POA: Diagnosis not present

## 2020-12-30 DIAGNOSIS — K921 Melena: Secondary | ICD-10-CM | POA: Diagnosis present

## 2020-12-30 DIAGNOSIS — Z7982 Long term (current) use of aspirin: Secondary | ICD-10-CM | POA: Insufficient documentation

## 2020-12-30 DIAGNOSIS — Z79899 Other long term (current) drug therapy: Secondary | ICD-10-CM | POA: Insufficient documentation

## 2020-12-30 HISTORY — DX: Anemia, unspecified: D64.9

## 2020-12-30 HISTORY — PX: ESOPHAGOGASTRODUODENOSCOPY (EGD) WITH PROPOFOL: SHX5813

## 2020-12-30 SURGERY — ESOPHAGOGASTRODUODENOSCOPY (EGD) WITH PROPOFOL
Anesthesia: General

## 2020-12-30 MED ORDER — PROPOFOL 10 MG/ML IV BOLUS
INTRAVENOUS | Status: AC
  Filled 2020-12-30: qty 20

## 2020-12-30 MED ORDER — SODIUM CHLORIDE 0.9 % IV SOLN: INTRAVENOUS | Status: DC

## 2020-12-30 MED ORDER — PROPOFOL 500 MG/50ML IV EMUL
INTRAVENOUS | Status: DC | PRN
  Administered 2020-12-30: 160 ug/kg/min via INTRAVENOUS

## 2020-12-30 MED ORDER — LIDOCAINE HCL (CARDIAC) PF 100 MG/5ML IV SOSY
PREFILLED_SYRINGE | INTRAVENOUS | Status: DC | PRN
  Administered 2020-12-30: 100 mg via INTRATRACHEAL

## 2020-12-30 MED ORDER — PROPOFOL 10 MG/ML IV BOLUS
INTRAVENOUS | Status: DC | PRN
  Administered 2020-12-30: 60 mg via INTRAVENOUS

## 2020-12-30 NOTE — Interval H&P Note (Signed)
History and Physical Interval Note:  12/30/2020 9:34 AM  Kristen Barrett  has presented today for surgery, with the diagnosis of ANEMIA MELENA.  The various methods of treatment have been discussed with the patient and family. After consideration of risks, benefits and other options for treatment, the patient has consented to  Procedure(s): ESOPHAGOGASTRODUODENOSCOPY (EGD) WITH PROPOFOL (N/A) as a surgical intervention.  The patient's history has been reviewed, patient examined, no change in status, stable for surgery.  I have reviewed the patient's chart and labs.  Questions were answered to the patient's satisfaction.     Lesly Rubenstein  Ok to proceed with EGD

## 2020-12-30 NOTE — Transfer of Care (Signed)
Immediate Anesthesia Transfer of Care Note  Patient: Armandina Stammer  Procedure(s) Performed: ESOPHAGOGASTRODUODENOSCOPY (EGD) WITH PROPOFOL (N/A )  Patient Location: PACU  Anesthesia Type:General  Level of Consciousness: awake  Airway & Oxygen Therapy: Patient Spontanous Breathing and Patient connected to nasal cannula oxygen  Post-op Assessment: Report given to RN and Post -op Vital signs reviewed and stable  Post vital signs: Reviewed and stable  Last Vitals:  Vitals Value Taken Time  BP 126/71 12/30/20 1001  Temp    Pulse 91 12/30/20 1002  Resp 21 12/30/20 1002  SpO2 94 % 12/30/20 1002  Vitals shown include unvalidated device data.  Last Pain:  Vitals:   12/30/20 0914  TempSrc: Temporal  PainSc: 0-No pain         Complications: No complications documented.

## 2020-12-30 NOTE — Op Note (Signed)
[facility] Gastroenterology Patient Name: Kristen Barrett Procedure Date: 12/30/2020 9:30 AM MRN: [id number] Account #: [id number] Date of Birth: July 26, 1941 Admit Type: Outpatient Age: 80 Room: [facility] ENDO ROOM 3 Gender: Female Note Status: Finalized Procedure:             Upper GI endoscopy Indications:           Melena Providers:             Andrey Farmer MD, MD Medicines:             Monitored Anesthesia Care Complications:         No immediate complications. Estimated blood loss:                         Minimal. Procedure:             Pre-Anesthesia Assessment:                        - Prior to the procedure, a History and Physical was                         performed, and patient medications and allergies were                         reviewed. The patient is competent. The risks and                         benefits of the procedure and the sedation options and                         risks were discussed with the patient. All questions                         were answered and informed consent was obtained.                         Patient identification and proposed procedure were                         verified by the physician, the nurse, the anesthetist                         and the technician in the endoscopy suite. Mental                         Status Examination: alert and oriented. Airway                         Examination: normal oropharyngeal airway and neck                         mobility. Respiratory Examination: clear to                         auscultation. CV Examination: normal. Prophylactic                         Antibiotics: The patient does not require prophylactic  antibiotics. Prior Anticoagulants: The patient has                         taken no previous anticoagulant or antiplatelet                         agents. ASA Grade Assessment: III - A patient with                         severe systemic disease.  After reviewing the risks and                         benefits, the patient was deemed in satisfactory                         condition to undergo the procedure. The anesthesia                         plan was to use monitored anesthesia care (MAC).                         Immediately prior to administration of medications,                         the patient was re-assessed for adequacy to receive                         sedatives. The heart rate, respiratory rate, oxygen                         saturations, blood pressure, adequacy of pulmonary                         ventilation, and response to care were monitored                         throughout the procedure. The physical status of the                         patient was re-assessed after the procedure.                        After obtaining informed consent, the endoscope was                         passed under direct vision. Throughout the procedure,                         the patient's blood pressure, pulse, and oxygen                         saturations were monitored continuously. The Endoscope                         was introduced through the mouth, and advanced to the                         second part of duodenum. The upper GI endoscopy was  accomplished without difficulty. The patient tolerated                         the procedure well. Findings:      A 6 cm hiatal hernia was present.      Diffuse atrophic mucosa was found in the entire examined stomach.       Biopsies were taken with a cold forceps for Helicobacter pylori testing.       Estimated blood loss was minimal.      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 There was oozing of blood from the edge of the diverticulum       and appeared similar to a marginal ulcer in a patient with gastric       bypass. Coagulation for hemostasis using argon plasma was successful.      The exam of the duodenum  was otherwise normal. Impression:            - 6 cm hiatal hernia.                        - Gastric mucosal atrophy. Biopsied.                        - Bleeding duodenal diverticulum. Treated with argon                         plasma coagulation (APC). Recommendation:        - Discharge patient to home.                        - Resume previous diet.                        - Continue present medications.                        - Await pathology results.                        - Return to referring physician as previously                         scheduled. Recommend monitoring for further melena and                         monitor hemoglobins regularly. If recurrent melena or                         IDA would recommend repeat EGD for evaluation.                        - No ibuprofen, naproxen, or other non-steroidal                         anti-inflammatory drugs. Procedure Code(s):     --- Professional ---                        32122, 59, Esophagogastroduodenoscopy, flexible,  transoral; with control of bleeding, any method                        43239, Esophagogastroduodenoscopy, flexible,                         transoral; with biopsy, single or multiple Diagnosis Code(s):     --- Professional ---                        K44.9, Diaphragmatic hernia without obstruction or                         gangrene                        K31.89, Other diseases of stomach and duodenum                        K57.11, Diverticulosis of small intestine without                         perforation or abscess with bleeding                        K92.1, Melena (includes Hematochezia) CPT copyright 2019 American Medical Association. All rights reserved. The codes documented in this report are preliminary and upon coder review may  be revised to meet current compliance requirements. Andrey Farmer MD, MD 12/30/2020 93:71:69 AM Number of Addenda: 0 Note Initiated On: 12/30/2020 9:30  AM Estimated Blood Loss:  Estimated blood loss was minimal.      [facility]

## 2020-12-30 NOTE — H&P (Signed)
Outpatient short stay form Pre-procedure 12/30/2020 9:32 AM Raylene Miyamoto MD, MPH  Primary Physician: Dr. Sabra Heck  Reason for visit:  Melena  History of present illness:   80 y/o lady who was on long term NSAID therapy who developed some melena that has resolved after stopping and starting PPI BID. Here for EGD for evaluation. No family history of GI malignancies. No blood thinners. No new symptoms.    Current Facility-Administered Medications:    0.9 %  sodium chloride infusion, , Intravenous, Continuous, Bindi Klomp, Hilton Cork, MD  Medications Prior to Admission  Medication Sig Dispense Refill Last Dose   acetaminophen (TYLENOL) 325 MG tablet Take 650 mg by mouth every 6 (six) hours as needed.   Past Week at Unknown time   aspirin 81 MG chewable tablet Chew by mouth daily.   12/29/2020 at Unknown time   Calcium Carb-Cholecalciferol (CALCIUM 600/VITAMIN D3) 600-800 MG-UNIT TABS Take 1 tablet by mouth daily.   12/29/2020 at Unknown time   calcium-vitamin D (OSCAL WITH D) 250-125 MG-UNIT tablet Take 1 tablet by mouth daily.   12/29/2020 at Unknown time   ferrous gluconate (FERGON) 324 MG tablet Take 324 mg by mouth daily with breakfast.   Past Week at Unknown time   galantamine (RAZADYNE) 8 MG tablet Take 8 mg by mouth 2 (two) times daily with a meal.    12/29/2020 at Unknown time   Magnesium 250 MG TABS Take 250 mg by mouth every morning.    12/29/2020 at Unknown time   pantoprazole (PROTONIX) 40 MG tablet Take 40 mg by mouth daily.   12/29/2020 at Unknown time   tiZANidine (ZANAFLEX) 2 MG tablet Take 1 tablet (2 mg total) by mouth every 6 (six) hours as needed for muscle spasms. 30 tablet 0 12/29/2020 at Unknown time   vitamin B-12 (CYANOCOBALAMIN) 1000 MCG tablet Take 1,000 mcg by mouth daily.   12/29/2020 at Unknown time   lisinopril-hydrochlorothiazide (PRINZIDE,ZESTORETIC) 20-25 MG tablet Take 1 tablet by mouth daily. (Patient not taking: Reported on 12/30/2020)   Not Taking at  Unknown time   Melatonin 5 MG TABS Take 5 mg by mouth at bedtime as needed (sleep). (Patient not taking: Reported on 12/30/2020)   Not Taking at Unknown time   Multiple Vitamins-Minerals (MULTIVITAMIN WITH MINERALS) tablet Take 1 tablet by mouth daily. With biotin (Patient not taking: Reported on 12/30/2020)   Not Taking at Unknown time   oxyCODONE (ROXICODONE) 5 MG immediate release tablet Take 1 tablet (5 mg total) by mouth every 4 (four) hours as needed for breakthrough pain. (Patient not taking: Reported on 12/30/2020) 30 tablet 0 Not Taking at Unknown time     No Known Allergies   Past Medical History:  Diagnosis Date   Anemia    Arthritis    Family history of adverse reaction to anesthesia    MOM-N/V   High cholesterol    Hypertension    Osteoporosis    Psoriasis    Scoliosis     Review of systems:  Otherwise negative.    Physical Exam  Gen: Alert, oriented. Appears stated age.  HEENT: PERRLA. Lungs: No respiratory distress CV: RRR Abd: soft, benign, no masses. BS+ Ext: No edema    Planned procedures: Proceed with EGD. The patient understands the nature of the planned procedure, indications, risks, alternatives and potential complications including but not limited to bleeding, infection, perforation, damage to internal organs and possible oversedation/side effects from anesthesia. The patient agrees and gives consent to proceed.  Please refer to procedure notes for findings, recommendations and patient disposition/instructions.     Raylene Miyamoto MD, MPH Gastroenterology 12/30/2020  9:32 AM

## 2020-12-30 NOTE — Anesthesia Preprocedure Evaluation (Addendum)
Anesthesia Evaluation  Patient identified by MRN, date of birth, ID band Patient awake    Reviewed: Allergy & Precautions, H&P , NPO status , Patient's Chart, lab work & pertinent test results  History of Anesthesia Complications (+) PONV and Family history of anesthesia reaction  Airway Mallampati: II  TM Distance: >3 FB Neck ROM: full    Dental  (+) Upper Dentures, Lower Dentures   Pulmonary neg pulmonary ROS, neg shortness of breath, neg sleep apnea, neg COPD, neg recent URI,           Cardiovascular hypertension, (-) angina(-) Past MI, (-) Cardiac Stents and (-) CABG (-) dysrhythmias      Neuro/Psych negative neurological ROS  negative psych ROS   GI/Hepatic negative GI ROS, Neg liver ROS,   Endo/Other  negative endocrine ROS  Renal/GU      Musculoskeletal  (+) Arthritis , Osteoarthritis,  scoliosis   Abdominal   Peds negative pediatric ROS (+)  Hematology negative hematology ROS (+)   Anesthesia Other Findings Past Medical History: No date: Arthritis No date: Family history of adverse reaction to anesthesia     Comment:  MOM-N/V No date: High cholesterol No date: Hypertension No date: Osteoporosis No date: Psoriasis No date: Scoliosis  Past Surgical History: No date: CHOLECYSTECTOMY No date: COLONOSCOPY 10/12/2016: COLONOSCOPY WITH PROPOFOL; N/A     Comment:  Procedure: COLONOSCOPY WITH PROPOFOL;  Surgeon: Manya Silvas, MD;  Location: [facility] ENDOSCOPY;  Service:               Endoscopy;  Laterality: N/A; No date: EYE SURGERY     Comment:  CATARACTS BIL No date: TONSILLECTOMY  BMI    Body Mass Index: 24.60 kg/m      Reproductive/Obstetrics negative OB ROS                             Anesthesia Physical  Anesthesia Plan  ASA: II  Anesthesia Plan: General   Post-op Pain Management:    Induction: Intravenous  PONV Risk Score and Plan: Propofol  infusion  Airway Management Planned: Nasal Cannula  Additional Equipment:   Intra-op Plan:   Post-operative Plan:   Informed Consent: I have reviewed the patients History and Physical, chart, labs and discussed the procedure including the risks, benefits and alternatives for the proposed anesthesia with the patient or authorized representative who has indicated his/her understanding and acceptance.     Dental Advisory Given  Plan Discussed with: Anesthesiologist and CRNA  Anesthesia Plan Comments:         Anesthesia Quick Evaluation

## 2021-01-01 NOTE — Anesthesia Postprocedure Evaluation (Signed)
Anesthesia Post Note  Patient: Armandina Stammer  Procedure(s) Performed: ESOPHAGOGASTRODUODENOSCOPY (EGD) WITH PROPOFOL (N/A )  Patient location during evaluation: Endoscopy Anesthesia Type: General Level of consciousness: awake and alert and oriented Pain management: pain level controlled Vital Signs Assessment: post-procedure vital signs reviewed and stable Respiratory status: spontaneous breathing Cardiovascular status: blood pressure returned to baseline Anesthetic complications: no   No complications documented.   Last Vitals:  Vitals:   12/30/20 0914 12/30/20 1002  BP: (!) 150/62   Pulse: 94   Resp: 16   Temp: 36.6 C 36.6 C  SpO2: 100%     Last Pain:  Vitals:   12/31/20 1150  TempSrc:   PainSc: 0-No pain                 CARROLL,PAUL

## 2021-01-02 ENCOUNTER — Encounter: Payer: Self-pay | Admitting: Gastroenterology

## 2021-01-02 LAB — SURGICAL PATHOLOGY

## 2021-06-09 ENCOUNTER — Other Ambulatory Visit: Payer: Self-pay | Admitting: Internal Medicine

## 2021-06-09 DIAGNOSIS — Z1231 Encounter for screening mammogram for malignant neoplasm of breast: Secondary | ICD-10-CM

## 2021-07-12 ENCOUNTER — Ambulatory Visit
Admission: RE | Admit: 2021-07-12 | Discharge: 2021-07-12 | Disposition: A | Discharge status: Home / Self Care | Payer: Medicare Other | Source: Ambulatory Visit | Attending: Internal Medicine | Admitting: Internal Medicine

## 2021-07-12 ENCOUNTER — Other Ambulatory Visit: Payer: Self-pay

## 2021-07-12 DIAGNOSIS — Z1231 Encounter for screening mammogram for malignant neoplasm of breast: Secondary | ICD-10-CM | POA: Insufficient documentation

## 2022-07-04 ENCOUNTER — Other Ambulatory Visit: Payer: Self-pay | Admitting: Internal Medicine

## 2022-07-04 DIAGNOSIS — Z1231 Encounter for screening mammogram for malignant neoplasm of breast: Secondary | ICD-10-CM

## 2022-08-01 ENCOUNTER — Ambulatory Visit
Admission: RE | Admit: 2022-08-01 | Discharge: 2022-08-01 | Disposition: A | Discharge status: Home / Self Care | Payer: Medicare Other | Source: Ambulatory Visit | Attending: Internal Medicine | Admitting: Internal Medicine

## 2022-08-01 DIAGNOSIS — Z1231 Encounter for screening mammogram for malignant neoplasm of breast: Secondary | ICD-10-CM | POA: Insufficient documentation

## 2023-01-17 IMAGING — MG MM DIGITAL SCREENING BILAT W/ TOMO AND CAD
6 of 12 series · 6 of 36 positions shown · non-contrast
Comparison: Previous exam(s).

CLINICAL DATA: Screening.

EXAM:
DIGITAL SCREENING BILATERAL MAMMOGRAM WITH TOMOSYNTHESIS AND CAD
TECHNIQUE: Bilateral screening digital craniocaudal and mediolateral oblique
mammograms were obtained. Bilateral screening digital breast
tomosynthesis was performed. The images were evaluated with
computer-aided detection.

[L CC synth-2D]
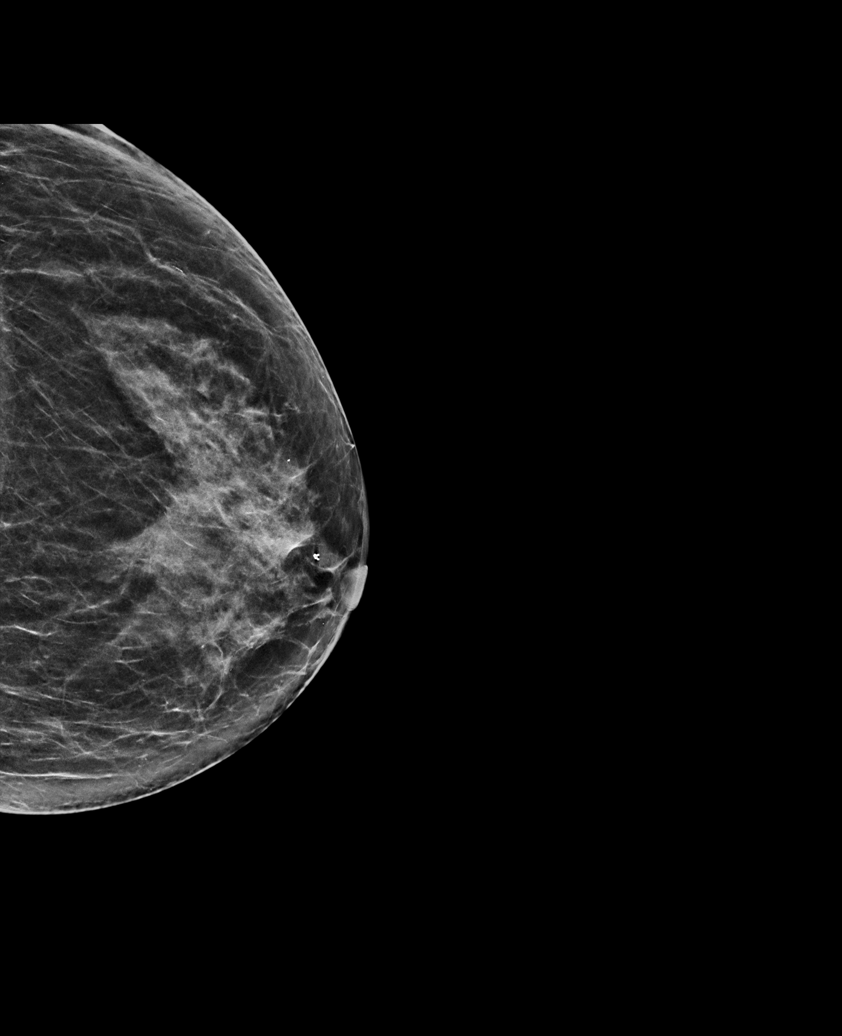

[R CC synth-2D]
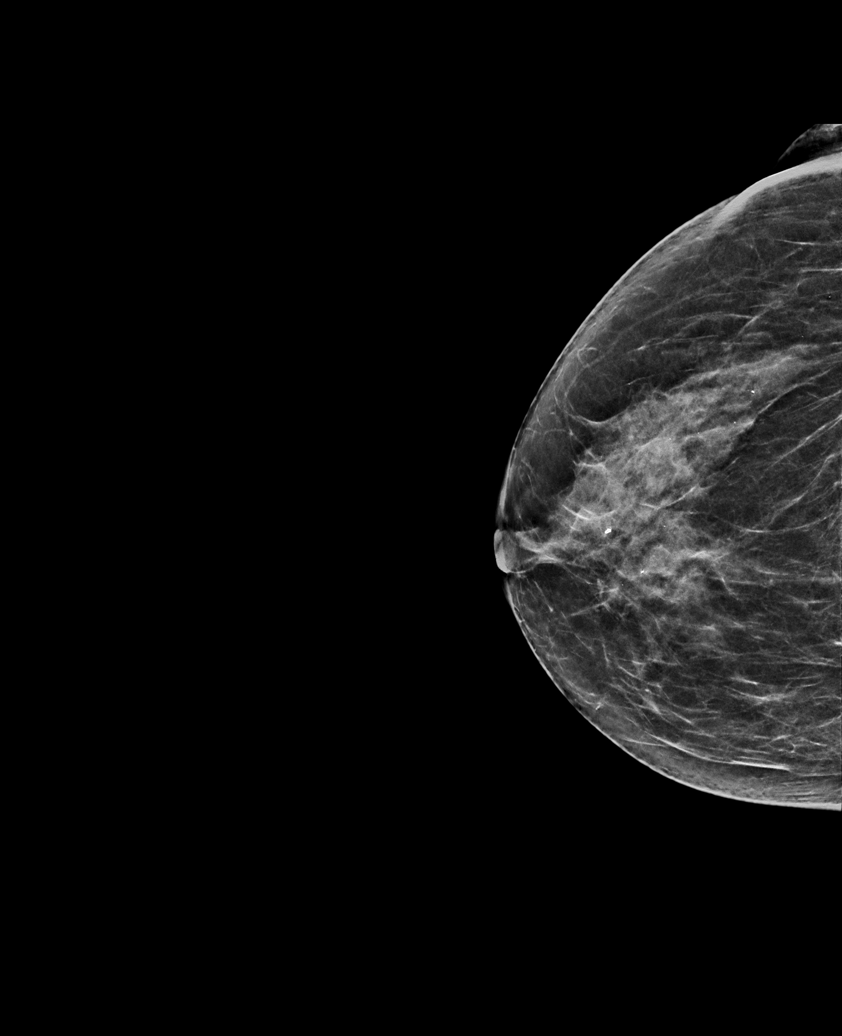

[L MLO synth-2D (1 of 2)]
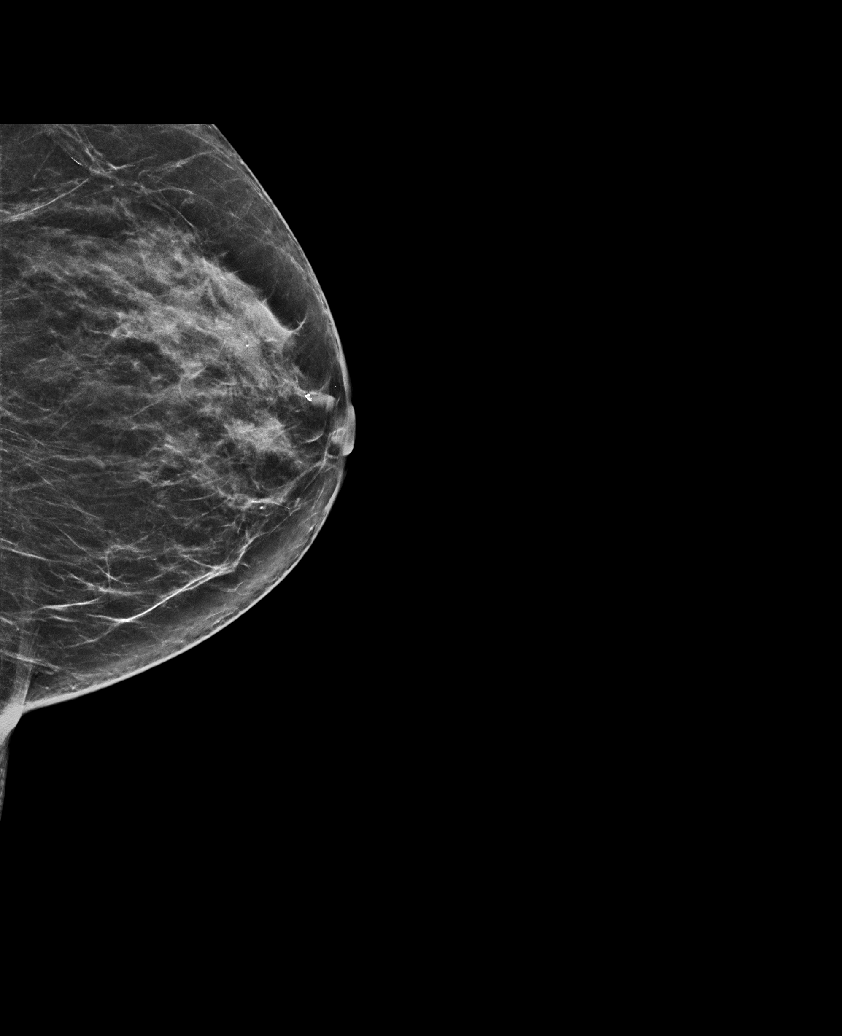

[L MLO synth-2D (2 of 2)]
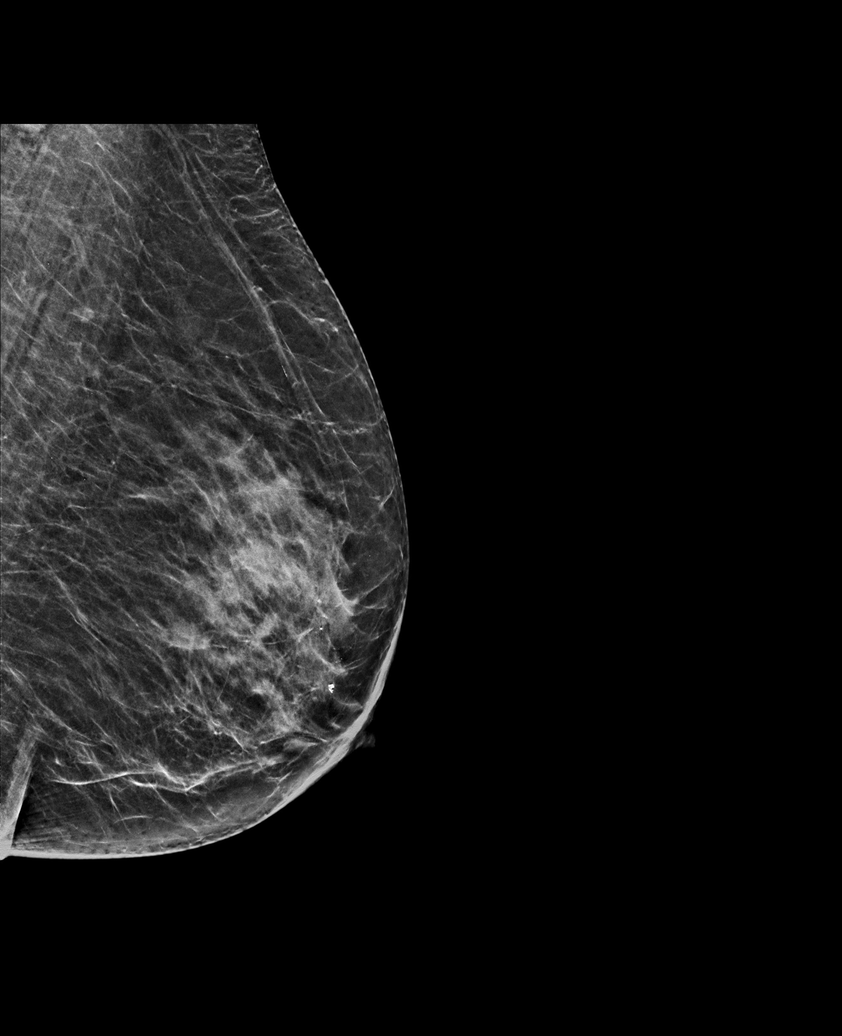

[R MLO synth-2D (1 of 2)]
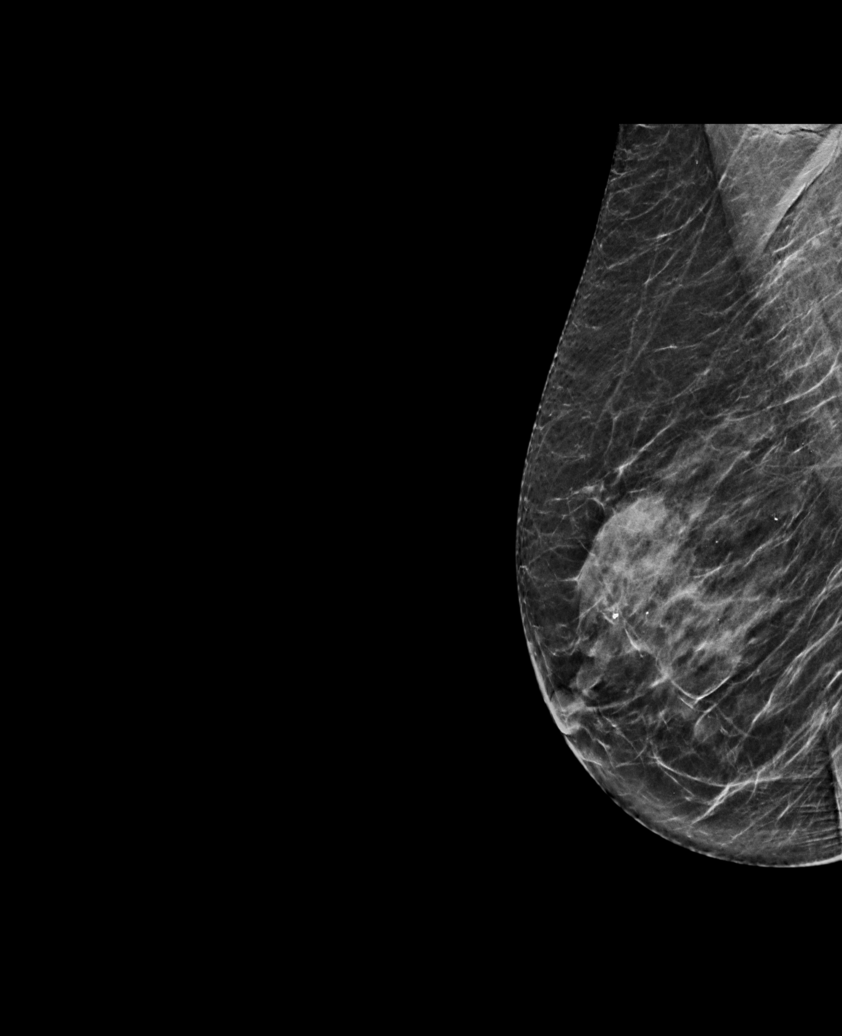

[R MLO synth-2D (2 of 2)]
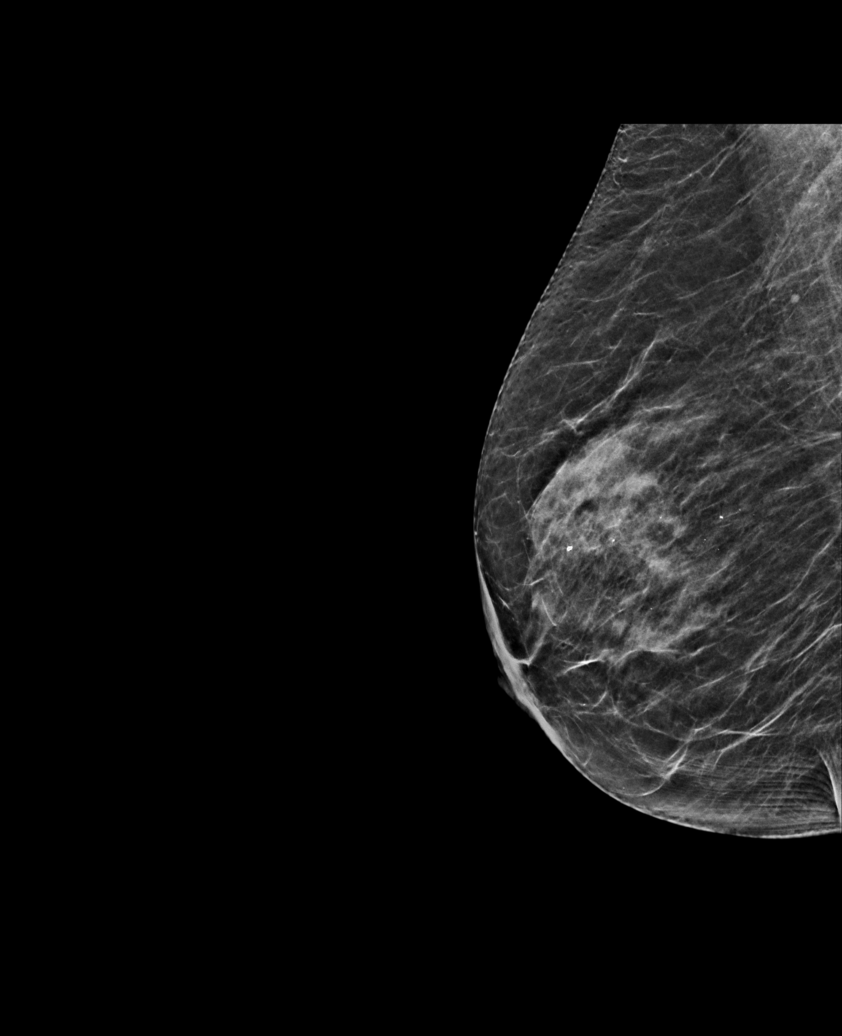

[6 of 36 positions shown; findings below may reference images not displayed]

ACR Breast Density Category c: The breast tissue is heterogeneously
dense, which may obscure small masses.
FINDINGS: There are no findings suspicious for malignancy.
IMPRESSION: No mammographic evidence of malignancy. A result letter of this
screening mammogram will be mailed directly to the patient.

RECOMMENDATION:
Screening mammogram in one year. (Code:Q3-W-BC3)

BI-RADS CATEGORY  1: Negative.

## 2023-06-26 ENCOUNTER — Other Ambulatory Visit: Payer: Self-pay | Admitting: Internal Medicine

## 2023-06-26 DIAGNOSIS — Z1231 Encounter for screening mammogram for malignant neoplasm of breast: Secondary | ICD-10-CM

## 2023-08-05 ENCOUNTER — Ambulatory Visit
Admission: RE | Admit: 2023-08-05 | Discharge: 2023-08-05 | Disposition: A | Discharge status: Home / Self Care | Payer: Medicare Other | Source: Ambulatory Visit | Attending: Internal Medicine | Admitting: Internal Medicine

## 2023-08-05 DIAGNOSIS — Z1231 Encounter for screening mammogram for malignant neoplasm of breast: Secondary | ICD-10-CM | POA: Insufficient documentation

## 2024-10-23 ENCOUNTER — Other Ambulatory Visit: Payer: Self-pay | Admitting: Internal Medicine

## 2024-10-23 DIAGNOSIS — Z1231 Encounter for screening mammogram for malignant neoplasm of breast: Secondary | ICD-10-CM

## 2024-11-18 ENCOUNTER — Ambulatory Visit
Admission: RE | Admit: 2024-11-18 | Discharge: 2024-11-18 | Disposition: A | Discharge status: Home / Self Care | Source: Ambulatory Visit | Attending: Internal Medicine | Admitting: Internal Medicine

## 2024-11-18 DIAGNOSIS — Z1231 Encounter for screening mammogram for malignant neoplasm of breast: Secondary | ICD-10-CM | POA: Insufficient documentation
